# Patient Record
Sex: Female | Born: 1985
Health system: Southern US, Community
[De-identification: ages and names within clinical notes are randomized; demographics above are authoritative.]

## PROBLEM LIST (undated history)

## (undated) DIAGNOSIS — N6452 Nipple discharge: Secondary | ICD-10-CM

## (undated) DIAGNOSIS — Z3046 Encounter for surveillance of implantable subdermal contraceptive: Secondary | ICD-10-CM

## (undated) DIAGNOSIS — N926 Irregular menstruation, unspecified: Secondary | ICD-10-CM

## (undated) DIAGNOSIS — Z309 Encounter for contraceptive management, unspecified: Secondary | ICD-10-CM

## (undated) DIAGNOSIS — Z975 Presence of (intrauterine) contraceptive device: Secondary | ICD-10-CM

## (undated) DIAGNOSIS — G43909 Migraine, unspecified, not intractable, without status migrainosus: Secondary | ICD-10-CM

## (undated) HISTORY — DX: Presence of (intrauterine) contraceptive device: Z97.5

## (undated) HISTORY — DX: Irregular menstruation, unspecified: N92.6

## (undated) HISTORY — DX: Migraine, unspecified, not intractable, without status migrainosus: G43.909

## (undated) HISTORY — DX: Encounter for contraceptive management, unspecified: Z30.9

## (undated) HISTORY — DX: Encounter for surveillance of implantable subdermal contraceptive: Z30.46

## (undated) HISTORY — DX: Nipple discharge: N64.52

---

## 2015-06-24 ENCOUNTER — Ambulatory Visit (INDEPENDENT_AMBULATORY_CARE_PROVIDER_SITE_OTHER): Payer: 59 | Admitting: Adult Health

## 2015-06-24 ENCOUNTER — Other Ambulatory Visit (HOSPITAL_COMMUNITY)
Admission: RE | Admit: 2015-06-24 | Discharge: 2015-06-24 | Disposition: A | Discharge status: Home / Self Care | Payer: 59 | Source: Ambulatory Visit | Attending: Adult Health | Admitting: Adult Health

## 2015-06-24 ENCOUNTER — Encounter: Payer: Self-pay | Admitting: Adult Health

## 2015-06-24 VITALS — BP 116/70 | HR 74 | Ht 63.0 in | Wt 205.0 lb

## 2015-06-24 DIAGNOSIS — Z01419 Encounter for gynecological examination (general) (routine) without abnormal findings: Secondary | ICD-10-CM

## 2015-06-24 DIAGNOSIS — Z30011 Encounter for initial prescription of contraceptive pills: Secondary | ICD-10-CM

## 2015-06-24 DIAGNOSIS — Z975 Presence of (intrauterine) contraceptive device: Secondary | ICD-10-CM

## 2015-06-24 DIAGNOSIS — Z309 Encounter for contraceptive management, unspecified: Secondary | ICD-10-CM | POA: Insufficient documentation

## 2015-06-24 DIAGNOSIS — Z113 Encounter for screening for infections with a predominantly sexual mode of transmission: Secondary | ICD-10-CM | POA: Diagnosis present

## 2015-06-24 DIAGNOSIS — N926 Irregular menstruation, unspecified: Secondary | ICD-10-CM

## 2015-06-24 DIAGNOSIS — N6452 Nipple discharge: Secondary | ICD-10-CM | POA: Insufficient documentation

## 2015-06-24 DIAGNOSIS — Z3202 Encounter for pregnancy test, result negative: Secondary | ICD-10-CM | POA: Diagnosis not present

## 2015-06-24 HISTORY — DX: Nipple discharge: N64.52

## 2015-06-24 HISTORY — DX: Encounter for contraceptive management, unspecified: Z30.9

## 2015-06-24 HISTORY — DX: Presence of (intrauterine) contraceptive device: Z97.5

## 2015-06-24 HISTORY — DX: Irregular menstruation, unspecified: N92.6

## 2015-06-24 LAB — POCT URINE PREGNANCY: PREG TEST UR: NEGATIVE

## 2015-06-24 MED ORDER — NORETHIN-ETH ESTRAD-FE BIPHAS 1 MG-10 MCG / 10 MCG PO TABS: 1.0000 | ORAL_TABLET | Freq: Every day | ORAL | Status: DC

## 2015-06-24 NOTE — Patient Instructions (Signed)
Start lo loestrin today Return in about 4 weeks for nexpalnon removal Physical in 1 year pap in 2-3 if normal

## 2015-06-24 NOTE — Progress Notes (Signed)
Patient ID: Laurie Johnson, female   DOB: 09/14/1985, 29 y.o.   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 She also has noticed migraines 1-2 per month recently.Had nexplanon inserted in June 2014 in [place].  Current Medications, Allergies, Past Medical History, Past Surgical History, Family History and Social History were reviewed in [facility] electronic medical record.     Review of Systems: Patient denies any daily headaches, hearing loss, fatigue, blurred vision, shortness of breath, chest pain, abdominal pain, problems with bowel movements, urination, or intercourse. No joint pain or mood swings.See HPI for positives.    Physical Exam:BP 116/70 mmHg  Pulse 74  Ht 5\' 3"  (1.6 m)  Wt 205 lb (92.987 kg)  BMI 36.32 kg/m2  LMP 06/11/2015 UPT negative General:  Well developed, well nourished, no acute distress Skin:  Warm and dry Neck:  Midline trachea, normal thyroid, good ROM, no lymphadenopathy Lungs; Clear to auscultation bilaterally Breast:  No dominant palpable mass, retraction, or nipple discharge Cardiovascular: Regular rate and rhythm Abdomen:  Soft, non tender, no hepatosplenomegaly Pelvic:  External genitalia is normal in appearance, no lesions.  The vagina is normal in appearance,has brown period like blood. Urethra has no lesions or masses. The cervix is smooth, pap with GC/CHL performed.Marland Kitchen.  Uterus is felt to be normal size, shape, and contour.  No adnexal masses or tenderness noted.Bladder is non tender, no masses felt. Extremities/musculoskeletal:  No swelling or varicosities noted,  no clubbing or cyanosis,nexplanon in left arm easily palpated  Psych:  No mood changes, alert and cooperative,seems happy Discussed trying megace or starting OCs and then taking nexplanon out in about a month and she wants to try OCs, now.  Impression: Well woman gyn exam and pap Irregular bleeding nexplanon in place Nipple discharge Contraceptive management    Plan: TSH and Prolactin drawn before breast exam Given 2 samples of lo loestrin to start now, lot [id number] A exp 7/17 Return 1/5 for nexpalnon removal Physical in 1 year

## 2015-06-25 ENCOUNTER — Telehealth: Payer: Self-pay | Admitting: Adult Health

## 2015-06-25 LAB — PROLACTIN: PROLACTIN: 9.5 ng/mL (ref 4.8–23.3)

## 2015-06-25 LAB — TSH: TSH: 0.662 u[IU]/mL (ref 0.450–4.500)

## 2015-06-25 NOTE — Telephone Encounter (Signed)
Pt aware labs normal  

## 2015-06-26 LAB — CYTOLOGY - PAP

## 2015-06-27 ENCOUNTER — Telehealth: Payer: Self-pay | Admitting: Adult Health

## 2015-06-27 NOTE — Telephone Encounter (Signed)
Left message pap was normal with negative GC/CHl but+ yeast if any itching or burning can use monistat

## 2015-07-24 ENCOUNTER — Encounter: Payer: Self-pay | Admitting: Adult Health

## 2015-07-24 ENCOUNTER — Ambulatory Visit (INDEPENDENT_AMBULATORY_CARE_PROVIDER_SITE_OTHER): Payer: 59 | Admitting: Adult Health

## 2015-07-24 VITALS — BP 100/72 | HR 86 | Ht 64.0 in | Wt 210.5 lb

## 2015-07-24 DIAGNOSIS — Z3046 Encounter for surveillance of implantable subdermal contraceptive: Secondary | ICD-10-CM

## 2015-07-24 HISTORY — DX: Encounter for surveillance of implantable subdermal contraceptive: Z30.46

## 2015-07-24 MED ORDER — NORETHIN-ETH ESTRAD-FE BIPHAS 1 MG-10 MCG / 10 MCG PO TABS: 1.0000 | ORAL_TABLET | Freq: Every day | ORAL | Status: DC

## 2015-07-24 MED FILL — LO LOESTRIN FE 1-10 TABLET: 1 MG-10 MCG | 28 days supply | Qty: 28 | Fill #0

## 2015-07-24 NOTE — Progress Notes (Signed)
Subjective:     Patient ID: Laurie BlackbirdAmanda Johnson, female   DOB: 11/11/1985, 30 y.o.   MRN: [id number]  HPI Laurie Johnson is a 30 year old white female in for nexplanon removal, she is on second pack of lo loestrin and likes it.  Review of Systems Patient denies any headaches, hearing loss, fatigue, blurred vision, shortness of breath, chest pain, abdominal pain, problems with bowel movements, urination, or intercourse. No joint pain or mood swings.For nexplanon removal.  Reviewed past medical,surgical, social and family history. Reviewed medications and allergies.     Objective:   Physical Exam BP 100/72 mmHg  Pulse 86  Ht 5\' 4"  (1.626 m)  Wt 210 lb 8 oz (95.482 kg)  BMI 36.11 kg/m2  LMP 07/18/2015  Consent signed, time out called,left arm cleansed with betadine, and injected with 1.5 cc 2% lidocaine and waited til numb.Under sterile technique a #11 blade was used to make small vertical incision, and rod was easily removed with my fingers. Steri strips applied. Pressure dressing applied.    Assessment:     Nexplanon removal    Plan:     Use condoms x 2 weeks, keep clean and dry x 24 hours, no heavy lifting, keep steri strips on x 72 hours, Keep pressure dressing on x 24 hours. Follow up prn problems. Continue lo loestrin   Refilled lo loestrin x 1 year at [facility]

## 2015-07-24 NOTE — Patient Instructions (Signed)
Use condoms x 2 weeks, keep clean and dry x 24 hours, no heavy lifting, keep steri strips on x 72 hours, Keep pressure dressing on x 24 hours. Follow up prn problems.  

## 2015-07-29 ENCOUNTER — Telehealth: Payer: Self-pay | Admitting: *Deleted

## 2015-07-29 MED ORDER — NORETHIN ACE-ETH ESTRAD-FE 1-20 MG-MCG PO TABS: 1.0000 | ORAL_TABLET | Freq: Every day | ORAL | Status: DC

## 2015-07-29 MED FILL — LARIN FE 1-20 TABLET: 1-20 | 28 days supply | Qty: 28 | Fill #0

## 2015-07-29 NOTE — Telephone Encounter (Signed)
Left message, will change to junel 1-20 to see if cheaper

## 2015-07-29 NOTE — Telephone Encounter (Signed)
Spoke with pt. Pt is on Lo Loestrin. It's 25.00 a month. She is requesting something cheaper. Please advise. Thanks!! JSY

## 2015-07-30 DIAGNOSIS — H04129 Dry eye syndrome of unspecified lacrimal gland: Secondary | ICD-10-CM | POA: Diagnosis not present

## 2015-10-03 MED FILL — LARIN FE 1-20 TABLET: 1-20 | 84 days supply | Qty: 84 | Fill #1

## 2015-12-11 MED FILL — NORETHIN-ESTRAD-FERR 1-0.02: 1-20 | 84 days supply | Qty: 84 | Fill #2

## 2016-01-28 DIAGNOSIS — Z111 Encounter for screening for respiratory tuberculosis: Secondary | ICD-10-CM | POA: Diagnosis not present

## 2016-02-23 MED FILL — NORETHIN-ESTRAD-FERR 1-0.02: 1-20 | 84 days supply | Qty: 84 | Fill #3

## 2016-04-16 ENCOUNTER — Encounter: Payer: Self-pay | Admitting: Adult Health

## 2016-05-24 MED FILL — NORETHIN-ESTRAD-FERR 1-0.02: 1-20 | 56 days supply | Qty: 56 | Fill #4

## 2016-07-15 ENCOUNTER — Encounter: Payer: Self-pay | Admitting: Adult Health

## 2016-07-15 ENCOUNTER — Ambulatory Visit (INDEPENDENT_AMBULATORY_CARE_PROVIDER_SITE_OTHER): Payer: 59 | Admitting: Adult Health

## 2016-07-15 VITALS — BP 121/69 | HR 103 | Ht 62.5 in | Wt 187.0 lb

## 2016-07-15 DIAGNOSIS — Z3041 Encounter for surveillance of contraceptive pills: Secondary | ICD-10-CM

## 2016-07-15 DIAGNOSIS — Z01419 Encounter for gynecological examination (general) (routine) without abnormal findings: Secondary | ICD-10-CM

## 2016-07-15 MED ORDER — NORETHIN ACE-ETH ESTRAD-FE 1-20 MG-MCG PO TABS: 1.0000 | ORAL_TABLET | Freq: Every day | ORAL | 4 refills | Status: DC

## 2016-07-15 MED FILL — NORETHIN-ESTRAD-FERR 1-0.02: 1-20 | 84 days supply | Qty: 84 | Fill #0

## 2016-07-15 NOTE — Patient Instructions (Addendum)
Physical in 1 year Pap in 2019 Labs next year

## 2016-07-15 NOTE — Progress Notes (Signed)
Patient ID: Laurie Johnson, female   DOB: 06/06/1986, 30 y.o.   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    Current Medications, Allergies, Past Medical History, Past Surgical History, Family History and Social History were reviewed in [facility] electronic medical record.     Review of Systems: Patient denies any daily headaches(maybe 1 every 2 weeks or so), hearing loss, fatigue, blurred vision, shortness of breath, chest pain, abdominal pain, problems with bowel movements, urination, or intercourse. No joint pain or mood swings.    Physical Exam:BP 121/69 (BP Location: Left Arm, Patient Position: Sitting, Cuff Size: Normal)   Pulse (!) 103   Ht 5' 2.5" (1.588 m)   Wt 187 lb (84.8 kg)   LMP 06/17/2016 (Approximate)   BMI 33.66 kg/m  General:  Well developed, well nourished, no acute distress Skin:  Warm and dry Neck:  Midline trachea, normal thyroid, good ROM, no lymphadenopathy Lungs; Clear to auscultation bilaterally Breast:  No dominant palpable mass, retraction, or nipple discharge Cardiovascular: Regular rate and rhythm Abdomen:  Soft, non tender, no hepatosplenomegaly Pelvic:  External genitalia is normal in appearance, no lesions.  The vagina is normal in appearance. Urethra has no lesions or masses. The cervix is smooth.  Uterus is felt to be normal size, shape, and contour.  No adnexal masses or tenderness noted.Bladder is non tender, no masses felt. Extremities/musculoskeletal:  No swelling or varicosities noted, no clubbing or cyanosis Psych:  No mood changes, alert and cooperative,seems happy PHQ 2 score ).  Impression: 1. Well woman exam with routine gynecological exam   2. Encounter for surveillance of contraceptive pills       Plan: Meds ordered this encounter  Medications  . norethindrone-ethinyl estradiol (JUNEL FE 1/20) 1-20 MG-MCG tablet    Sig: Take 1 tablet by mouth daily.    Dispense:  3 Package    Refill:  4    Order Specific Question:   Supervising Provider    Answer:   Lazaro ArmsEURE, LUTHER H [[id number]]  Labs next year Physical in 1 year Pap in 2019

## 2016-09-06 ENCOUNTER — Telehealth: Payer: 59 | Admitting: Nurse Practitioner

## 2016-09-06 DIAGNOSIS — R6889 Other general symptoms and signs: Secondary | ICD-10-CM

## 2016-09-06 MED ORDER — OSELTAMIVIR PHOSPHATE 75 MG PO CAPS: 75.0000 mg | ORAL_CAPSULE | Freq: Two times a day (BID) | ORAL | 0 refills | Status: AC

## 2016-09-06 NOTE — Progress Notes (Signed)

## 2016-10-12 MED FILL — NORETHIN-ESTRAD-FERR 1-0.02: 1-20 | 84 days supply | Qty: 84 | Fill #1

## 2016-12-31 ENCOUNTER — Encounter: Payer: Self-pay | Admitting: Adult Health

## 2017-01-03 MED FILL — NORETHIN-ESTRAD-FERR 1-0.02: 1-20 | 84 days supply | Qty: 84 | Fill #2

## 2017-01-12 DIAGNOSIS — H5213 Myopia, bilateral: Secondary | ICD-10-CM | POA: Diagnosis not present

## 2017-03-22 MED FILL — NORETHIN-ESTRAD-FERR 1-0.02: 1-20 | 84 days supply | Qty: 84 | Fill #3 | Status: TO

## 2017-06-16 MED FILL — LARIN FE 1-20 TABLET: 1-20 | 84 days supply | Qty: 84 | Fill #0

## 2017-06-29 ENCOUNTER — Other Ambulatory Visit: Payer: 59 | Admitting: Adult Health

## 2017-07-27 ENCOUNTER — Encounter: Payer: Self-pay | Admitting: Adult Health

## 2017-07-27 ENCOUNTER — Ambulatory Visit (INDEPENDENT_AMBULATORY_CARE_PROVIDER_SITE_OTHER): Payer: 59 | Admitting: Adult Health

## 2017-07-27 ENCOUNTER — Other Ambulatory Visit: Payer: Self-pay

## 2017-07-27 VITALS — BP 112/68 | HR 93 | Ht 62.5 in | Wt 183.0 lb

## 2017-07-27 DIAGNOSIS — Z3041 Encounter for surveillance of contraceptive pills: Secondary | ICD-10-CM

## 2017-07-27 DIAGNOSIS — Z01411 Encounter for gynecological examination (general) (routine) with abnormal findings: Secondary | ICD-10-CM | POA: Diagnosis not present

## 2017-07-27 DIAGNOSIS — Z01419 Encounter for gynecological examination (general) (routine) without abnormal findings: Secondary | ICD-10-CM

## 2017-07-27 DIAGNOSIS — N6323 Unspecified lump in the left breast, lower outer quadrant: Secondary | ICD-10-CM

## 2017-07-27 MED ORDER — NORETHIN ACE-ETH ESTRAD-FE 1-20 MG-MCG PO TABS: 1.0000 | ORAL_TABLET | Freq: Every day | ORAL | 4 refills | Status: DC

## 2017-07-27 NOTE — Progress Notes (Signed)
Patient ID: Laurie Johnson, female   DOB: 06/18/1986, 32 y.o.   MRN: [id number] History of Present Illness:  Laurie Johnson is a 32 year old white female, married in for well woman gyn exam, she had normal pap 06/24/15. She works in [employer].  Current Medications, Allergies, Past Medical History, Past Surgical History, Family History and Social History were reviewed in [facility] electronic medical record.     Review of Systems: Patient denies any headaches, hearing loss, fatigue, blurred vision, shortness of breath, chest pain, abdominal pain, problems with bowel movements, urination, or intercourse. No joint pain or mood swings. Has noticed discoloration left breast for a month, no itching or pain She is happy with her OCs.    Physical Exam:BP 112/68 (BP Location: Left Arm, Patient Position: Sitting, Cuff Size: Normal)   Pulse 93   Ht 5' 2.5" (1.588 m)   Wt 183 lb (83 kg)   LMP 07/17/2017   BMI 32.94 kg/m  General:  Well developed, well nourished, no acute distress Skin:  Warm and dry Neck:  Midline trachea, normal thyroid, good ROM, no lymphadenopathy Lungs; Clear to auscultation bilaterally Breast:  No dominant palpable mass, retraction, or nipple discharge on right, on left no retraction or nipple discharge, has circular area of discoloration at about 3 o'clcok and 1 cm round mass that is tender there also. Cardiovascular: Regular rate and rhythm Abdomen:  Soft, non tender, no hepatosplenomegaly Pelvic:  External genitalia is normal in appearance, no lesions.  The vagina is normal in appearance. Urethra has no lesions or masses. The cervix is bulbous.  Uterus is felt to be normal size, shape, and contour.  No adnexal masses or tenderness noted.Bladder is non tender, no masses felt. Extremities/musculoskeletal:  No swelling or varicosities noted, no clubbing or cyanosis Psych:  No mood changes, alert and cooperative,seems happy PHQ 2 score 0.  Impression: 1. Well woman exam  with routine gynecological exam   2. Encounter for surveillance of contraceptive pills   3. Mass of lower outer quadrant of left breast       Plan: Meds ordered this encounter  Medications  . DISCONTD: norethindrone-ethinyl estradiol (JUNEL FE 1/20) 1-20 MG-MCG tablet    Sig: Take 1 tablet by mouth daily.    Dispense:  3 Package    Refill:  4    Order Specific Question:   Supervising Provider    Answer:   Despina HiddenEURE, LUTHER H [[id number]]  . norethindrone-ethinyl estradiol (JUNEL FE 1/20) 1-20 MG-MCG tablet    Sig: Take 1 tablet by mouth daily.    Dispense:  3 Package    Refill:  4    Order Specific Question:   Supervising Provider    Answer:   Duane LopeEURE, LUTHER H [[id number]]  Check CBC,CMP,TSH and lipids Bilateral diagnostic bilateral mammogram and [facility] if needed at [facility] 1/22 at 10:20 am Pap and physical in 1 year

## 2017-07-28 LAB — COMPREHENSIVE METABOLIC PANEL
ALBUMIN: 4.5 g/dL (ref 3.5–5.5)
ALT: 18 IU/L (ref 0–32)
AST: 14 IU/L (ref 0–40)
Albumin/Globulin Ratio: 1.7 (ref 1.2–2.2)
Alkaline Phosphatase: 43 IU/L (ref 39–117)
BUN / CREAT RATIO: 24 — AB (ref 9–23)
BUN: 16 mg/dL (ref 6–20)
Bilirubin Total: 0.5 mg/dL (ref 0.0–1.2)
CO2: 22 mmol/L (ref 20–29)
CREATININE: 0.68 mg/dL (ref 0.57–1.00)
Calcium: 9.3 mg/dL (ref 8.7–10.2)
Chloride: 102 mmol/L (ref 96–106)
GFR calc Af Amer: 136 mL/min/{1.73_m2} (ref >59)
GFR calc non Af Amer: 118 mL/min/{1.73_m2} (ref >59)
GLUCOSE: 86 mg/dL (ref 65–99)
Globulin, Total: 2.6 g/dL (ref 1.5–4.5)
Potassium: 4.1 mmol/L (ref 3.5–5.2)
Sodium: 142 mmol/L (ref 134–144)
Total Protein: 7.1 g/dL (ref 6.0–8.5)

## 2017-07-28 LAB — LIPID PANEL
CHOL/HDL RATIO: 3.1 ratio (ref 0.0–4.4)
Cholesterol, Total: 160 mg/dL (ref 100–199)
HDL: 52 mg/dL (ref >39)
LDL CALC: 93 mg/dL (ref 0–99)
TRIGLYCERIDES: 76 mg/dL (ref 0–149)
VLDL CHOLESTEROL CAL: 15 mg/dL (ref 5–40)

## 2017-07-28 LAB — CBC
HEMATOCRIT: 42.2 % (ref 34.0–46.6)
Hemoglobin: 14.3 g/dL (ref 11.1–15.9)
MCH: 29.1 pg (ref 26.6–33.0)
MCHC: 33.9 g/dL (ref 31.5–35.7)
MCV: 86 fL (ref 79–97)
Platelets: 144 10*3/uL — ABNORMAL LOW (ref 150–379)
RBC: 4.92 x10E6/uL (ref 3.77–5.28)
RDW: 13.7 % (ref 12.3–15.4)
WBC: 8 10*3/uL (ref 3.4–10.8)

## 2017-07-28 LAB — TSH: TSH: 1.3 u[IU]/mL (ref 0.450–4.500)

## 2017-08-09 ENCOUNTER — Ambulatory Visit (HOSPITAL_COMMUNITY)
Admission: RE | Admit: 2017-08-09 | Discharge: 2017-08-09 | Disposition: A | Discharge status: Home / Self Care | Payer: 59 | Source: Ambulatory Visit | Attending: Adult Health | Admitting: Adult Health

## 2017-08-09 ENCOUNTER — Encounter (HOSPITAL_COMMUNITY): Payer: Self-pay

## 2017-08-09 DIAGNOSIS — R928 Other abnormal and inconclusive findings on diagnostic imaging of breast: Secondary | ICD-10-CM | POA: Diagnosis not present

## 2017-08-09 DIAGNOSIS — N6323 Unspecified lump in the left breast, lower outer quadrant: Secondary | ICD-10-CM | POA: Insufficient documentation

## 2017-08-09 DIAGNOSIS — N6489 Other specified disorders of breast: Secondary | ICD-10-CM | POA: Diagnosis not present

## 2017-09-08 MED FILL — LARIN FE 1-20 TABLET: 1-20 | 84 days supply | Qty: 84 | Fill #0

## 2017-09-08 MED FILL — SHIPPING COST: 1 days supply | Qty: 1 | Fill #0

## 2017-10-05 DIAGNOSIS — Z0000 Encounter for general adult medical examination without abnormal findings: Secondary | ICD-10-CM | POA: Diagnosis not present

## 2017-10-07 ENCOUNTER — Other Ambulatory Visit (HOSPITAL_COMMUNITY)
Admission: RE | Admit: 2017-10-07 | Discharge: 2017-10-07 | Disposition: A | Discharge status: Home / Self Care | Payer: 59 | Source: Other Acute Inpatient Hospital | Attending: Pulmonary Disease | Admitting: Pulmonary Disease

## 2017-10-07 DIAGNOSIS — F17200 Nicotine dependence, unspecified, uncomplicated: Secondary | ICD-10-CM | POA: Insufficient documentation

## 2017-10-07 DIAGNOSIS — Z0000 Encounter for general adult medical examination without abnormal findings: Secondary | ICD-10-CM | POA: Insufficient documentation

## 2017-10-07 DIAGNOSIS — G43909 Migraine, unspecified, not intractable, without status migrainosus: Secondary | ICD-10-CM | POA: Insufficient documentation

## 2017-10-12 LAB — MISC LABCORP TEST (SEND OUT): LABCORP TEST CODE: 511345

## 2017-11-28 MED FILL — SHIPPING COST: 1 days supply | Qty: 1 | Fill #1

## 2017-11-28 MED FILL — LARIN FE 1-20 TABLET: 1-20 | 84 days supply | Qty: 84 | Fill #1

## 2018-01-18 DIAGNOSIS — H5213 Myopia, bilateral: Secondary | ICD-10-CM | POA: Diagnosis not present

## 2018-01-31 DIAGNOSIS — H6991 Unspecified Eustachian tube disorder, right ear: Secondary | ICD-10-CM | POA: Diagnosis not present

## 2018-03-02 ENCOUNTER — Other Ambulatory Visit (INDEPENDENT_AMBULATORY_CARE_PROVIDER_SITE_OTHER): Payer: Self-pay | Admitting: Otolaryngology

## 2018-03-02 ENCOUNTER — Ambulatory Visit (INDEPENDENT_AMBULATORY_CARE_PROVIDER_SITE_OTHER): Payer: 59 | Admitting: Otolaryngology

## 2018-03-02 DIAGNOSIS — H9011 Conductive hearing loss, unilateral, right ear, with unrestricted hearing on the contralateral side: Secondary | ICD-10-CM

## 2018-03-02 DIAGNOSIS — R42 Dizziness and giddiness: Secondary | ICD-10-CM

## 2018-03-02 DIAGNOSIS — H9190 Unspecified hearing loss, unspecified ear: Secondary | ICD-10-CM

## 2018-03-03 MED FILL — LARIN FE 1-20 TABLET: 1-20 | 84 days supply | Qty: 84 | Fill #2

## 2018-03-03 MED FILL — SHIPPING COST: 1 days supply | Qty: 1 | Fill #2

## 2018-03-06 ENCOUNTER — Ambulatory Visit (HOSPITAL_COMMUNITY)
Admission: RE | Admit: 2018-03-06 | Discharge: 2018-03-06 | Disposition: A | Discharge status: Home / Self Care | Payer: 59 | Source: Ambulatory Visit | Attending: Otolaryngology | Admitting: Otolaryngology

## 2018-03-06 DIAGNOSIS — R42 Dizziness and giddiness: Secondary | ICD-10-CM | POA: Diagnosis not present

## 2018-03-06 DIAGNOSIS — H9190 Unspecified hearing loss, unspecified ear: Secondary | ICD-10-CM | POA: Insufficient documentation

## 2018-03-06 DIAGNOSIS — H93A9 Pulsatile tinnitus, unspecified ear: Secondary | ICD-10-CM | POA: Diagnosis not present

## 2018-03-23 ENCOUNTER — Ambulatory Visit (INDEPENDENT_AMBULATORY_CARE_PROVIDER_SITE_OTHER): Payer: 59 | Admitting: Otolaryngology

## 2018-03-23 ENCOUNTER — Other Ambulatory Visit (INDEPENDENT_AMBULATORY_CARE_PROVIDER_SITE_OTHER): Payer: Self-pay | Admitting: Otolaryngology

## 2018-03-23 DIAGNOSIS — R42 Dizziness and giddiness: Secondary | ICD-10-CM

## 2018-03-23 DIAGNOSIS — H9011 Conductive hearing loss, unilateral, right ear, with unrestricted hearing on the contralateral side: Secondary | ICD-10-CM

## 2018-03-23 DIAGNOSIS — H9311 Tinnitus, right ear: Secondary | ICD-10-CM | POA: Diagnosis not present

## 2018-03-23 DIAGNOSIS — R51 Headache: Secondary | ICD-10-CM

## 2018-03-23 DIAGNOSIS — H9319 Tinnitus, unspecified ear: Secondary | ICD-10-CM

## 2018-03-30 ENCOUNTER — Ambulatory Visit (HOSPITAL_COMMUNITY)
Admission: RE | Admit: 2018-03-30 | Discharge: 2018-03-30 | Disposition: A | Discharge status: Home / Self Care | Payer: 59 | Source: Ambulatory Visit | Attending: Otolaryngology | Admitting: Otolaryngology

## 2018-03-30 DIAGNOSIS — H9311 Tinnitus, right ear: Secondary | ICD-10-CM | POA: Diagnosis not present

## 2018-03-30 DIAGNOSIS — H93A1 Pulsatile tinnitus, right ear: Secondary | ICD-10-CM | POA: Diagnosis not present

## 2018-03-30 DIAGNOSIS — H9319 Tinnitus, unspecified ear: Secondary | ICD-10-CM

## 2018-03-30 MED ORDER — GADOBENATE DIMEGLUMINE 529 MG/ML IV SOLN
17.0000 mL | Freq: Once | INTRAVENOUS | Status: AC | PRN
  Administered 2018-03-30: 17 mL via INTRAVENOUS

## 2018-05-19 MED FILL — NORETHIN-ESTRAD-FERR 1-0.02: 1-20 | 84 days supply | Qty: 84 | Fill #3

## 2018-05-19 MED FILL — SHIPPING COST: 1 days supply | Qty: 1 | Fill #3

## 2018-08-16 ENCOUNTER — Other Ambulatory Visit: Payer: Self-pay | Admitting: Adult Health

## 2018-08-16 MED FILL — SHIPPING COST: 1 days supply | Qty: 1 | Fill #4

## 2018-08-16 MED FILL — BLISOVI FE 1/20 1-20 MG-MCG: 1-20 | 84 days supply | Qty: 84 | Fill #0

## 2018-08-23 ENCOUNTER — Encounter: Payer: Self-pay | Admitting: Obstetrics and Gynecology

## 2018-08-23 ENCOUNTER — Other Ambulatory Visit (HOSPITAL_COMMUNITY)
Admission: RE | Admit: 2018-08-23 | Discharge: 2018-08-23 | Disposition: A | Discharge status: Home / Self Care | Payer: 59 | Source: Ambulatory Visit | Attending: Obstetrics and Gynecology | Admitting: Obstetrics and Gynecology

## 2018-08-23 ENCOUNTER — Ambulatory Visit (INDEPENDENT_AMBULATORY_CARE_PROVIDER_SITE_OTHER): Payer: 59 | Admitting: Obstetrics and Gynecology

## 2018-08-23 VITALS — BP 122/72 | HR 95 | Ht 63.5 in | Wt 195.0 lb

## 2018-08-23 DIAGNOSIS — Z01419 Encounter for gynecological examination (general) (routine) without abnormal findings: Secondary | ICD-10-CM | POA: Diagnosis not present

## 2018-08-23 DIAGNOSIS — N6320 Unspecified lump in the left breast, unspecified quadrant: Secondary | ICD-10-CM

## 2018-08-23 NOTE — Progress Notes (Addendum)
Patient ID: Laurie Johnson, female   DOB: [birth year], 33 y.o.   MRN: [id number]  Assessment:  Annual Gyn Exam Contraceptive management Hx precocious puberty age 47 Persistent 1 cm left breast mass with negative w/u 2019 Plan:  1. pap smear done, next pap due 3 years 2. Take OCP continuously. Only cease for 5 days instead of 7 if BTB occurs.pt to notify me at 1 yr for refil of OCP.thru myChart. 3    Annual mammogram advised at age 48 consider 3-D as baseline  Subjective:  Laurie Johnson is a 33 y.o. female G0P0000 who presents for annual exam. No LMP recorded. The patient has complaints today of has bad headache on placebo pills. Has a lump found last year, had mammogram and u/s, there was no concern. Suha says she can still feel the lump when she lays down but doesn't give her any worry.  She is monitoring the cyst and is comfortable with this plan.  We discussed when she might consider obtaining a repeat ultrasound and mammogram..  We have agreed that she would seek reassessment if there was any change in self-exam, or at age 23  The following portions of the patient's history were reviewed and updated as appropriate: allergies, current medications, past family history, past medical history, past social history, past surgical history and problem list. Past Medical History:  Diagnosis Date  . Contraceptive management 06/24/2015  . Irregular bleeding 06/24/2015  . Migraines   . Nexplanon in place 06/24/2015  . Nexplanon removal 07/24/2015  . Nipple discharge 06/24/2015    No past surgical history on file.   Current Outpatient Medications:  .  norethindrone-ethinyl estradiol (JUNEL FE,GILDESS FE,LOESTRIN FE) 1-20 MG-MCG tablet, TAKE 1 TABLET BY MOUTH DAILY., Disp: 84 tablet, Rfl: 4  Review of Systems Constitutional: negative Gastrointestinal: negative Genitourinary: normal  Objective:  There were no vitals taken for this visit.   BMI: There is no height or weight on file to calculate BMI.   General Appearance: Alert, appropriate appearance for age. No acute distress HEENT: Grossly normal Neck / Thyroid:  Cardiovascular: RRR; normal S1, S2, no murmur Lungs: CTA bilaterally Back: No CVAT Breast Exam: Small smooth mobile, lump on left breast. No dimpling, nipple retraction or discharge.  Gastrointestinal: Soft, non-tender, no masses or organomegaly Pelvic Exam:   VAGINA:normal CERVIX:light spotting after PAP exam UTERUS: uterus is normal size, shape, consistency and nontender, Left ovary minimally large but palpable  PAP: Pap smear done today. Lymphatic Exam: Non-palpable nodes in neck, clavicular, axillary, or inguinal regions Skin: no rash or abnormalities Neurologic: Normal gait and speech, no tremor  Psychiatric: Alert and oriented, appropriate affect.  Urinalysis:Not done  By signing my name below, I, Arnette Norris, attest that this documentation has been prepared under the direction and in the presence of Tilda Burrow, MD. Electronically Signed: Arnette Norris Medical Scribe. 08/23/18. 9:15 AM.  I personally performed the services described in this documentation, which was SCRIBED in my presence. The recorded information has been reviewed and considered accurate. It has been edited as necessary during review. Tilda Burrow, MD

## 2018-09-05 LAB — CYTOLOGY - PAP
Diagnosis: NEGATIVE
HPV: DETECTED — AB

## 2018-10-09 DIAGNOSIS — Z0000 Encounter for general adult medical examination without abnormal findings: Secondary | ICD-10-CM | POA: Diagnosis not present

## 2018-10-23 MED FILL — NORETHIN-ESTRAD-FERR 1-0.02: 1-20 | 84 days supply | Qty: 84 | Fill #1

## 2018-12-26 ENCOUNTER — Other Ambulatory Visit: Payer: Self-pay | Admitting: Obstetrics and Gynecology

## 2018-12-26 MED ORDER — NORETHIN ACE-ETH ESTRAD-FE 1-20 MG-MCG PO TABS: 1.0000 | ORAL_TABLET | Freq: Every day | ORAL | 4 refills | Status: AC

## 2018-12-26 MED FILL — BLISOVI FE 1/20 1-20 MG-MCG: 1-20 | 84 days supply | Qty: 112 | Fill #0

## 2018-12-26 NOTE — Progress Notes (Signed)
Refil Ocp's x 1 yr completed. Pt to take OCP using continuous method.

## 2019-02-26 DIAGNOSIS — R1032 Left lower quadrant pain: Secondary | ICD-10-CM

## 2019-03-05 ENCOUNTER — Other Ambulatory Visit: Payer: Self-pay

## 2019-03-05 ENCOUNTER — Ambulatory Visit (HOSPITAL_COMMUNITY)
Admission: RE | Admit: 2019-03-05 | Discharge: 2019-03-05 | Disposition: A | Discharge status: Home / Self Care | Payer: 59 | Source: Ambulatory Visit | Attending: Obstetrics and Gynecology | Admitting: Obstetrics and Gynecology

## 2019-03-05 DIAGNOSIS — R1032 Left lower quadrant pain: Secondary | ICD-10-CM | POA: Insufficient documentation

## 2019-03-08 ENCOUNTER — Ambulatory Visit (HOSPITAL_COMMUNITY): Admission: RE | Admit: 2019-03-08 | Payer: 59 | Source: Ambulatory Visit

## 2019-03-09 DIAGNOSIS — H5213 Myopia, bilateral: Secondary | ICD-10-CM | POA: Diagnosis not present

## 2019-03-16 MED FILL — BLISOVI FE 1/20 1-20 MG-MCG: 1-20 | 84 days supply | Qty: 112 | Fill #1

## 2019-06-12 MED FILL — BLISOVI FE 1/20 1-20 MG-MCG: 1-20 | 84 days supply | Qty: 112 | Fill #2

## 2019-07-29 ENCOUNTER — Encounter (INDEPENDENT_AMBULATORY_CARE_PROVIDER_SITE_OTHER): Payer: Self-pay

## 2019-07-29 ENCOUNTER — Telehealth: Payer: 59 | Admitting: Family

## 2019-07-29 ENCOUNTER — Telehealth: Payer: Self-pay

## 2019-07-29 DIAGNOSIS — U071 COVID-19: Secondary | ICD-10-CM

## 2019-07-29 MED ORDER — BENZONATATE 100 MG PO CAPS: 100.0000 mg | ORAL_CAPSULE | Freq: Three times a day (TID) | ORAL | 0 refills | Status: AC | PRN

## 2019-07-29 MED ORDER — PROMETHAZINE-DM 6.25-15 MG/5ML PO SYRP: 5.0000 mL | ORAL_SOLUTION | Freq: Three times a day (TID) | ORAL | 0 refills | Status: AC | PRN

## 2019-07-29 MED ORDER — DEXAMETHASONE 6 MG PO TABS: 6.0000 mg | ORAL_TABLET | Freq: Every day | ORAL | 0 refills | Status: AC

## 2019-07-29 NOTE — Telephone Encounter (Signed)
Pt c/o decreased appetite. Pt is tolerating fluids without difficulty. Advised pt that if appetite gets worse to drink fluids as tolerated and work her way up to bland solid foods like crackers, pretzels, soup, bread, applesauce and boiled starches. Advised pt if unable to tolerate any foods or liquids, to call PCP. Advised pt that is she develops severe vomiting (more than 6 times a day and/or greater than 8 hours) and/or severe abdominal pain advised pt to call 911 and seek tx in the ED.

## 2019-07-29 NOTE — Progress Notes (Signed)
Your test for COVID-19 was positive, meaning that you were infected with the novel coronavirus and could give the germ to others.    You have been enrolled in MyChart Home Monitoring for COVID-19. Daily you will receive a questionnaire within the MyChart website. Our COVID-19 response team will be monitoring your responses daily.  Please continue isolation at home, for at least 10 days since the start of your symptoms and until you have had 24 hours with no fever (without taking a fever reducer) and with improving of symptoms.  Please continue good preventive care measures, including:  frequent hand-washing, avoid touching your face, cover coughs/sneezes, stay out of crowds and keep a 6 foot distance from others.  Recheck or go to the nearest hospital ED tent for re-assessment if fever/cough/breathlessness return. he following symptoms may appear 2-14 days after exposure: . Fever . Cough . Shortness of breath or difficulty breathing . Chills . Repeated shaking with chills . Muscle pain . Headache . Sore throat . New loss of taste or smell . Fatigue . Congestion or runny nose . Nausea or vomiting . Diarrhea  Go to the nearest hospital ED for assessment if fever/cough/breathlessness are severe or illness seems like a threat to life.  It is vitally important that if you feel that you have an infection such as this virus or any other virus that you stay home and away from places where you may spread it to others.  You should avoid contact with people age 9 and older.   You can use medication such as A prescription cough medication called Tessalon Perles 100 mg. You may take 1-2 capsules every 8 hours as needed for cough and A prescription cough medication called Phenergan DM 6.25 mg/15 mg. You make take one teaspoon / 5 ml every 4-6 hours as needed for cough and I have sent in dexamethasone 6 mg daily for 5 days.   You may also take acetaminophen (Tylenol) as needed for fever.  Reduce your  risk of any infection by using the same precautions used for avoiding the common cold or flu:  Marland Kitchen Wash your hands often with soap and warm water for at least 20 seconds.  If soap and water are not readily available, use an alcohol-based hand sanitizer with at least 60% alcohol.  . If coughing or sneezing, cover your mouth and nose by coughing or sneezing into the elbow areas of your shirt or coat, into a tissue or into your sleeve (not your hands). . Avoid shaking hands with others and consider head nods or verbal greetings only. . Avoid touching your eyes, nose, or mouth with unwashed hands.  . Avoid close contact with people who are sick. . Avoid places or events with large numbers of people in one location, like concerts or sporting events. . Carefully consider travel plans you have or are making. . If you are planning any travel outside or inside the [place], visit the CDC's Travelers' Health webpage for the latest health notices. . If you have some symptoms but not all symptoms, continue to monitor at home and seek medical attention if your symptoms worsen. . If you are having a medical emergency, call 911.  HOME CARE . Only take medications as instructed by your medical team. . Drink plenty of fluids and get plenty of rest. . A steam or ultrasonic humidifier can help if you have congestion.   GET HELP RIGHT AWAY IF YOU HAVE EMERGENCY WARNING SIGNS** FOR COVID-19. If you or  someone is showing any of these signs seek emergency medical care immediately. Call 911 or proceed to your closest emergency facility if: . You develop worsening high fever. . Trouble breathing . Bluish lips or face . Persistent pain or pressure in the chest . New confusion . Inability to wake or stay awake . You cough up blood. . Your symptoms become more severe  **This list is not all possible symptoms. Contact your medical provider for any symptoms that are sever or concerning to you.  MAKE SURE YOU   Understand  these instructions.  Will watch your condition.  Will get help right away if you are not doing well or get worse.  Your e-visit answers were reviewed by a board certified advanced clinical practitioner to complete your personal care plan.  Depending on the condition, your plan could have included both over the counter or prescription medications.  If there is a problem please reply once you have received a response from your provider.  Your safety is important to [place].  If you have drug allergies check your prescription carefully.    You can use MyChart to ask questions about today's visit, request a non-urgent call back, or ask for a work or school excuse for 24 hours related to this e-Visit. If it has been greater than 24 hours you will need to follow up with your provider, or enter a new e-Visit to address those concerns. You will get an e-mail in the next two days asking about your experience.  I hope that your e-visit has been valuable and will speed your recovery. Thank you for using e-visits.   Approximately 5 minutes was spent documenting and reviewing patient's chart.

## 2019-07-30 ENCOUNTER — Encounter (INDEPENDENT_AMBULATORY_CARE_PROVIDER_SITE_OTHER): Payer: Self-pay

## 2019-08-05 ENCOUNTER — Encounter (INDEPENDENT_AMBULATORY_CARE_PROVIDER_SITE_OTHER): Payer: Self-pay

## 2019-08-05 ENCOUNTER — Telehealth: Payer: 59 | Admitting: Physician Assistant

## 2019-08-05 DIAGNOSIS — J019 Acute sinusitis, unspecified: Secondary | ICD-10-CM

## 2019-08-05 DIAGNOSIS — B9689 Other specified bacterial agents as the cause of diseases classified elsewhere: Secondary | ICD-10-CM | POA: Diagnosis not present

## 2019-08-05 MED ORDER — AMOXICILLIN-POT CLAVULANATE 875-125 MG PO TABS: 1.0000 | ORAL_TABLET | Freq: Two times a day (BID) | ORAL | 0 refills | Status: AC

## 2019-08-05 NOTE — Progress Notes (Signed)

## 2019-08-05 NOTE — Progress Notes (Signed)
I have spent 5 minutes in review of e-visit questionnaire, review and updating patient chart, medical decision making and response to patient.   Aamina Skiff Cody Deyjah Kindel, PA-C    

## 2019-08-05 NOTE — Addendum Note (Signed)
Addended by: Waldon Merl on: 08/05/2019 10:08 AM   Modules accepted: Orders

## 2019-08-05 NOTE — Progress Notes (Signed)
Message sent to patient requesting further input regarding current symptoms. Awaiting patient response.  

## 2019-09-04 IMAGING — MR MR MRA HEAD W/O CM
1 series · 12 of 48 positions shown · IV contrast (multihance)
Comparison: None available.

CLINICAL DATA: Initial evaluation for asymmetric right-sided
pulsatile tinnitus.

EXAM:
MRI HEAD WITHOUT AND WITH CONTRAST
MRA HEAD WITHOUT CONTRAST
MRV HEAD WITHOUT CONTRAST
TECHNIQUE: Multiplanar, multiecho pulse sequences of the brain and surrounding
structures were obtained without and with intravenous contrast. An
IAC protocol was utilized. Angiographic images of the head were
obtained using MRA technique without contrast.
CONTRAST:  17mL MULTIHANCE GADOBENATE DIMEGLUMINE 529 MG/ML IV SOLN

[Series 2: MRA · axial · 0.6mm · 0.28mm/px · z∈[-77,+37]mm · 12 of 200 slices shown]
[im 1/200]
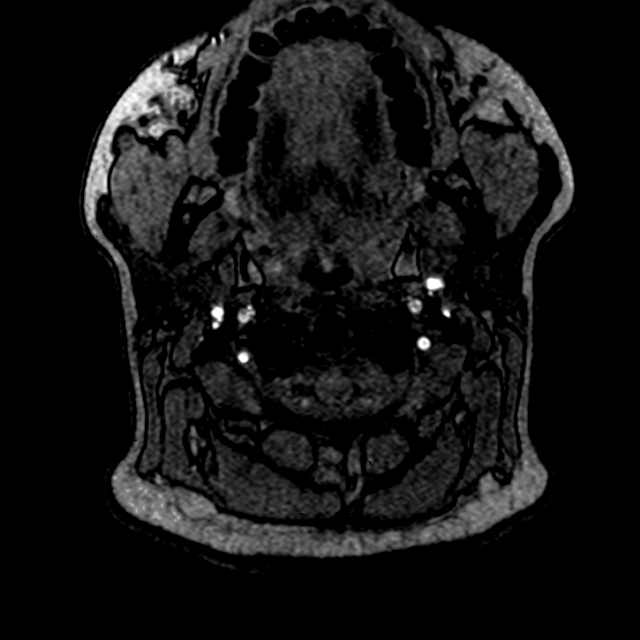
[im 13/200]
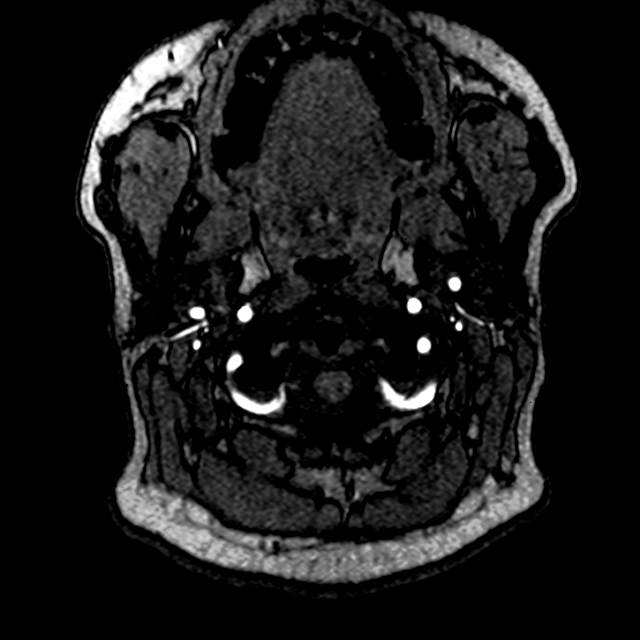
[im 34/200]
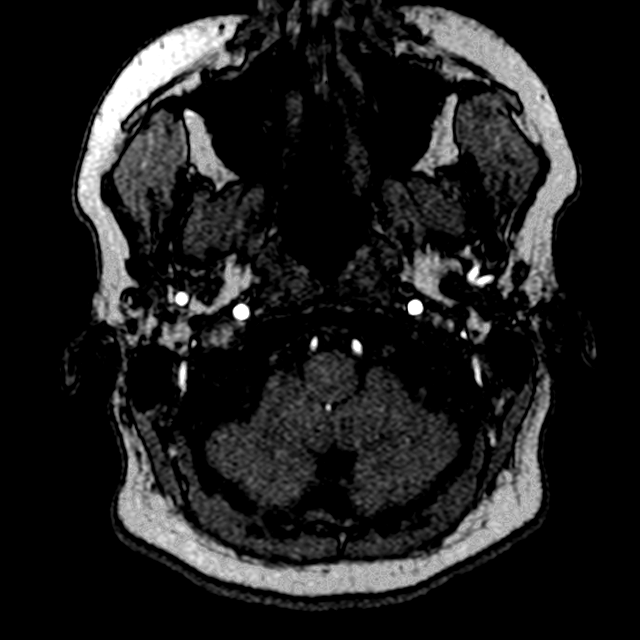
[im 39/200]
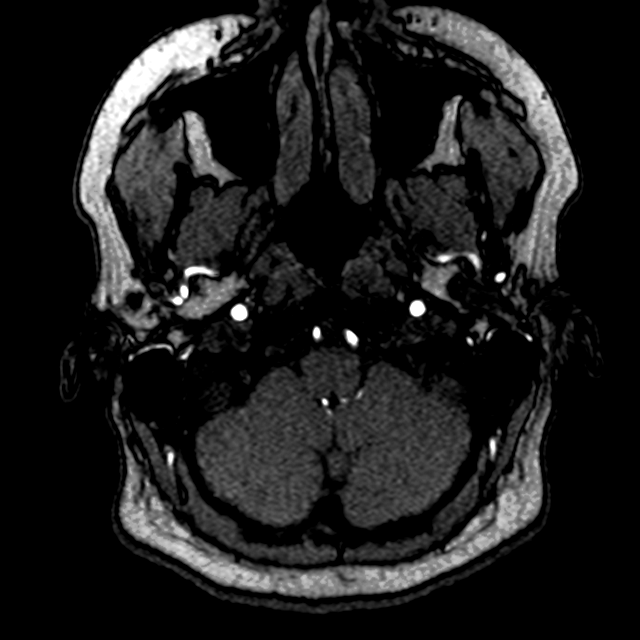
[im 64/200]
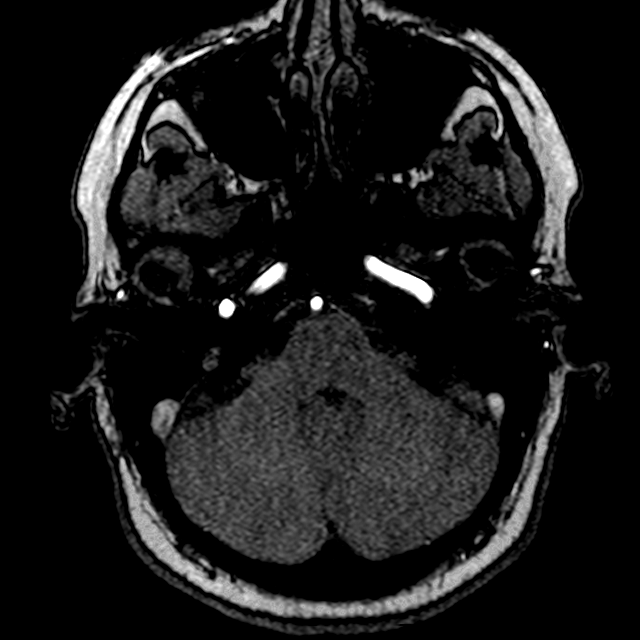
[im 89/200]
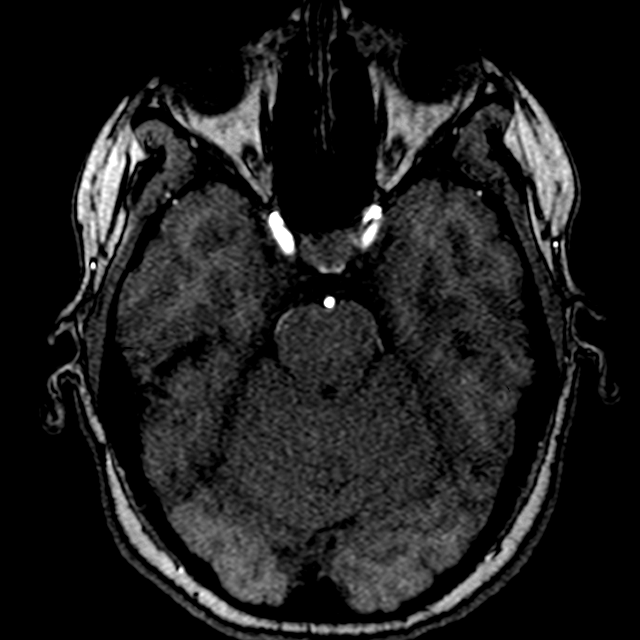
[im 102/200]
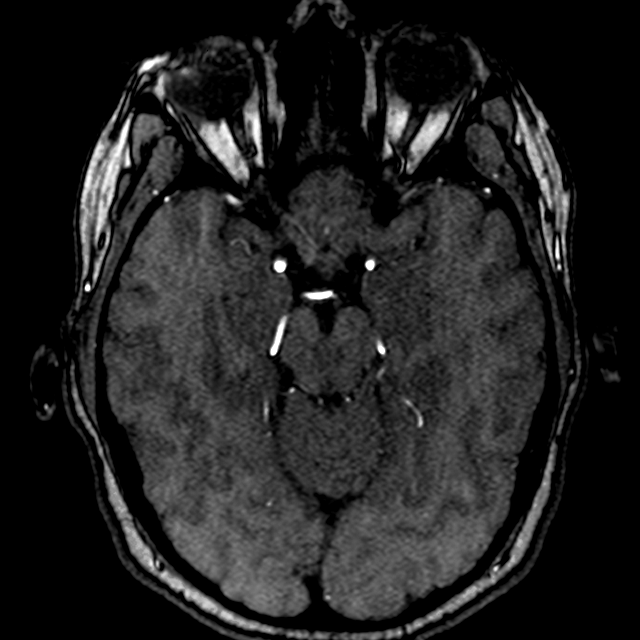
[im 115/200]
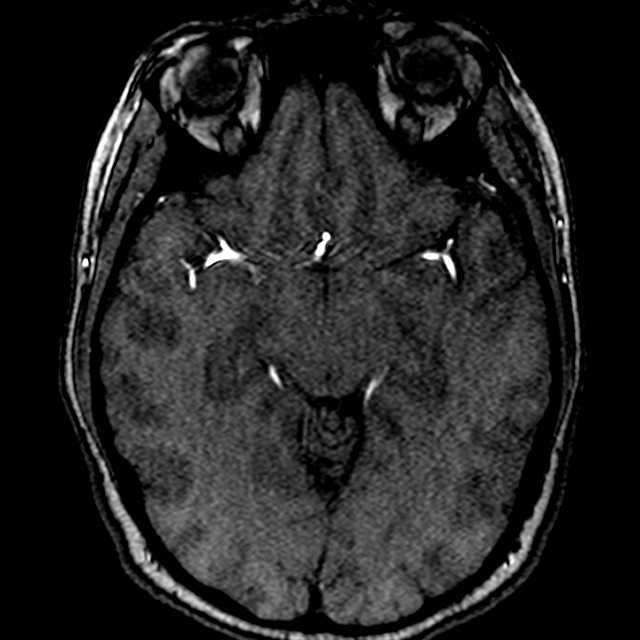
[im 140/200]
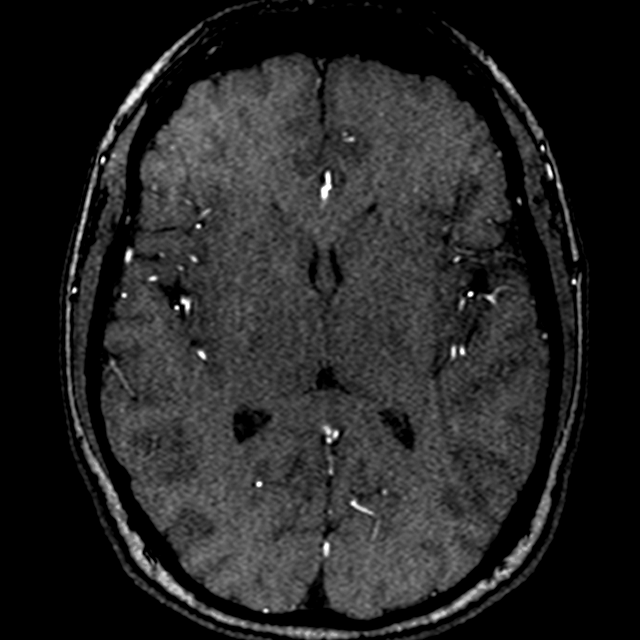
[im 166/200]
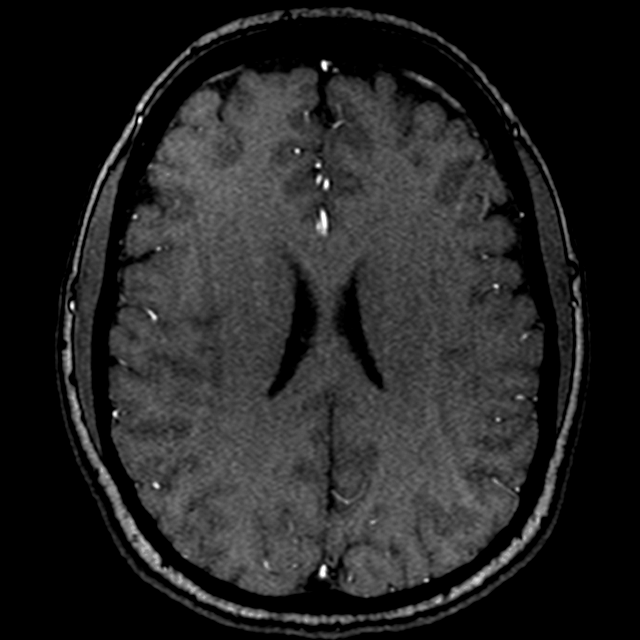
[im 170/200]
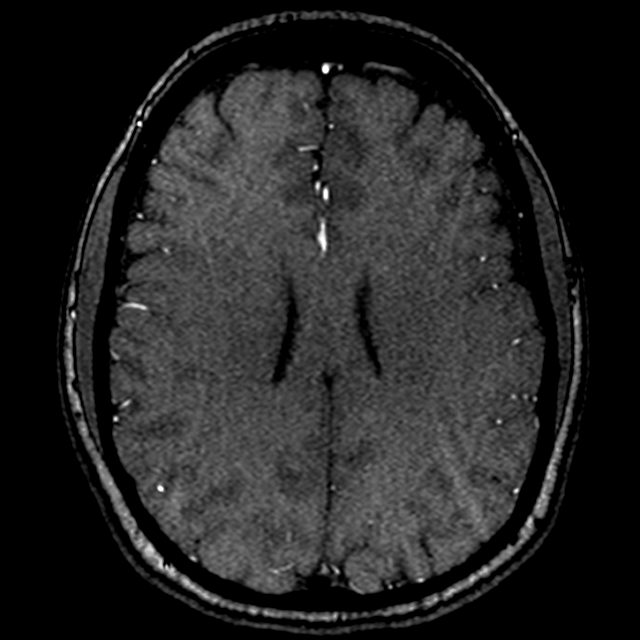
[im 191/200]
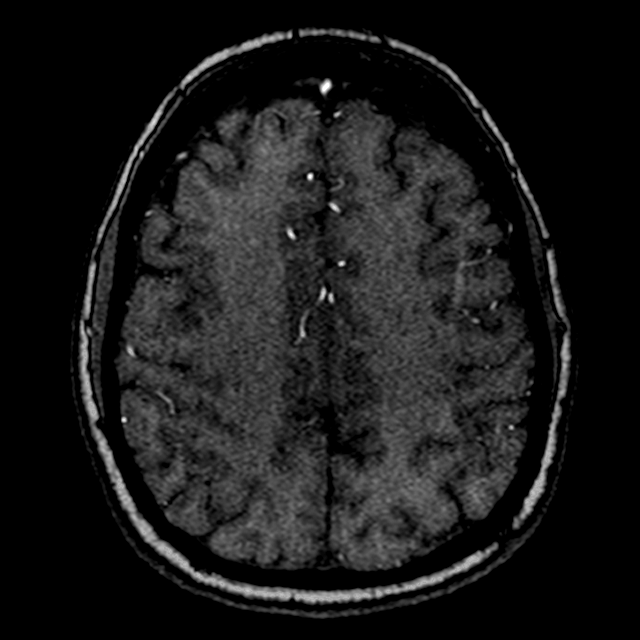

[12 of 48 positions shown; findings below may reference images not displayed]

FINDINGS: MRI HEAD FINDINGS

Brain: Cerebral volume normal for age. No focal parenchymal signal
abnormality. No abnormal foci of restricted diffusion to suggest
acute or subacute ischemia. Gray-white matter differentiation
maintained. No areas of remote or chronic infarction. No acute or
chronic intracranial hemorrhage.

No mass lesion, midline shift or mass effect. Ventricles normal size
without hydrocephalus. No extra-axial fluid collection. No abnormal
enhancement within the brain. Pituitary gland normal.

Thin section imaging through the internal auditory canals was
performed. Seventh and eighth cranial nerves are seen coursing
normally through the cerebellopontine angle cisterns into the
internal auditory canals. No CPA angle mass. No intracanalicular
mass or abnormal enhancement. Inner ear structures including the
vestibulae, cochlea, and semi circular canals are normal. Normal
post ganglionic enhancement seen within the seventh cranial nerves
bilaterally. No mastoid effusion.

Vascular: Normal intracranial flow voids maintained.

Skull and upper cervical spine: Craniocervical junction normal. Bone
marrow signal intensity normal. No scalp soft tissue abnormality.

Sinuses/Orbits: Globes and orbital soft tissues within normal
limits. Paranasal sinuses are clear.

Other: None.

MRA HEAD FINDINGS

ANTERIOR CIRCULATION:

Internal carotid arteries follow a normal course and are widely
patent to the ICA termini without stenosis. A1 segments, anterior
communicating artery, and anterior cerebral arteries widely patent
to their distal aspects without stenosis. Normal MCA bifurcations.
Distal MCA branches well perfused and symmetric. Patent.

POSTERIOR CIRCULATION:

Vertebral arteries widely patent to the vertebrobasilar junction
without stenosis. Left vertebral artery dominant. Posterior inferior
cerebral arteries patent bilaterally. Basilar widely patent to its
distal aspect without stenosis. Superior cerebellar and posterior
cerebral arteries widely patent bilaterally.

No intracranial aneurysm.

MRV HEAD FINDINGS

Superior sagittal sinus widely patent to the torcula. Transverse,
sigmoid sinuses, and proximal internal jugular veins are patent
bilaterally. Right transverse sinus dominant. Straight sinus, vein
of Haag, and internal cerebral veins patent bilaterally. No
evidence for dural sinus thrombosis or other abnormality.
IMPRESSION: 1. Normal IAC protocol MRI of the brain.
2. Normal intracranial MRA.
3. Normal intracranial MRV. No structural findings to explain
patient's symptoms identified.

## 2019-09-11 MED FILL — BLISOVI FE 1/20 1-20 MG-MCG: 1-20 | 63 days supply | Qty: 84 | Fill #3
